# Patient Record
Sex: Male | Born: 1965 | Race: White | Hispanic: No | Marital: Married | Smoking: Never smoker
Health system: Southern US, Community
[De-identification: ages and names within clinical notes are randomized; demographics above are authoritative.]

## PROBLEM LIST (undated history)

## (undated) DIAGNOSIS — I10 Essential (primary) hypertension: Secondary | ICD-10-CM

---

## 2004-02-13 ENCOUNTER — Ambulatory Visit: Payer: Self-pay | Admitting: Infectious Diseases

## 2004-02-13 ENCOUNTER — Inpatient Hospital Stay (HOSPITAL_COMMUNITY): Admission: EM | Admit: 2004-02-13 | Discharge: 2004-02-19 | Payer: Self-pay | Admitting: Emergency Medicine

## 2014-09-12 ENCOUNTER — Emergency Department (INDEPENDENT_AMBULATORY_CARE_PROVIDER_SITE_OTHER): Payer: 59

## 2014-09-12 ENCOUNTER — Encounter: Payer: Self-pay | Admitting: *Deleted

## 2014-09-12 ENCOUNTER — Emergency Department
Admission: EM | Admit: 2014-09-12 | Discharge: 2014-09-12 | Disposition: A | Discharge status: Home / Self Care | Payer: 59 | Source: Home / Self Care | Attending: Family Medicine | Admitting: Family Medicine

## 2014-09-12 DIAGNOSIS — S62662B Nondisplaced fracture of distal phalanx of right middle finger, initial encounter for open fracture: Secondary | ICD-10-CM | POA: Diagnosis not present

## 2014-09-12 DIAGNOSIS — S62632A Displaced fracture of distal phalanx of right middle finger, initial encounter for closed fracture: Secondary | ICD-10-CM

## 2014-09-12 DIAGNOSIS — W108XXA Fall (on) (from) other stairs and steps, initial encounter: Secondary | ICD-10-CM

## 2014-09-12 DIAGNOSIS — W109XXA Fall (on) (from) unspecified stairs and steps, initial encounter: Secondary | ICD-10-CM

## 2014-09-12 DIAGNOSIS — X58XXXA Exposure to other specified factors, initial encounter: Secondary | ICD-10-CM

## 2014-09-12 DIAGNOSIS — S61212A Laceration without foreign body of right middle finger without damage to nail, initial encounter: Secondary | ICD-10-CM

## 2014-09-12 HISTORY — DX: Essential (primary) hypertension: I10

## 2014-09-12 MED ORDER — CEPHALEXIN 500 MG PO CAPS: 500.0000 mg | ORAL_CAPSULE | Freq: Two times a day (BID) | ORAL | Status: DC

## 2014-09-12 NOTE — ED Notes (Signed)
Pt reports tripping and felling into his stairs, jamming his right 3rd finger today. The skin @ the bend of the finger broke open. Irrigated with NS. Tetanus UTD.

## 2014-09-12 NOTE — ED Provider Notes (Signed)
CSN: [id number]     Arrival date & time 09/12/14  1808 History   First MD Initiated Contact with Patient 09/12/14 1819     Chief Complaint  Patient presents with  . Finger Injury   (Consider location/radiation/quality/duration/timing/severity/associated sxs/prior Treatment) HPI Pt is a 49yo male presenting to [facility] with c/o pain to Right middle finger after trip and fall on stairs just PTA. Pt states he jammed his Right middle finger into the stairs trying to catch his fall. Pain is aching and sore, mild in severity. Minimally worse with movement. Pt notes laceration across joint on the bottom of his finger. Bleeding controlled PTA. Denies hitting his head or any other injuries from fall. No pain medication taken PTA. Tetanus is UTD.  Past Medical History  Diagnosis Date  . Hypertension    History reviewed. No pertinent past surgical history. History reviewed. No pertinent family history. Social History  Substance Use Topics  . Smoking status: Never Smoker   . Smokeless tobacco: Never Used  . Alcohol Use: Yes    Review of Systems  Musculoskeletal: Positive for myalgias and arthralgias. Negative for joint swelling.       Right middle finger  Skin: Positive for wound. Negative for color change.  Neurological: Negative for weakness and numbness.    Allergies  Omnicef  Home Medications   Prior to Admission medications   Medication Sig Start Date End Date Taking? Authorizing Provider  lisinopril (PRINIVIL,ZESTRIL) 40 MG tablet Take 40 mg by mouth daily.   Yes Historical Provider, MD  Nebivolol HCl (BYSTOLIC PO) Take by mouth.   Yes Historical Provider, MD  cephALEXin (KEFLEX) 500 MG capsule Take 1 capsule (500 mg total) by mouth 2 (two) times daily. 10 09/12/14   Junius Finner, PA-C   Meds Ordered and Administered this Visit  Medications - No data to display  BP 134/87 mmHg  Pulse 68  Resp 14  Ht 6' 0.5" (1.842 m)  Wt 214 lb (97.07 kg)  BMI 28.61 kg/m2  SpO2 97% No data  found.   Physical Exam  Constitutional: He is oriented to person, place, and time. He appears well-developed and well-nourished.  HENT:  Head: Normocephalic and atraumatic.  Eyes: EOM are normal.  Neck: Normal range of motion.  Cardiovascular: Normal rate.   Right middle finger: cap refill <3 seconds  Pulmonary/Chest: Effort normal.  Musculoskeletal: Normal range of motion. He exhibits tenderness. He exhibits no edema.  Right middle finger: laceration on volar aspect DIP joint, full flexion and extension with 5/5 strength. Mild tenderness.  Neurological: He is alert and oriented to person, place, and time.  Right middle finger, distal aspect: sensation to light and sharp touch in tact.  Skin: Skin is warm and dry.  Distal Right middle finger, DIP joint, volar aspect: 1cm superficial laceration. Bleeding controlled. No foreign bodies seen or palpated.  Psychiatric: He has a normal mood and affect. His behavior is normal.  Nursing note and vitals reviewed.   ED Course  LACERATION REPAIR Date/Time: 09/13/2014 9:57 AM Performed by: Junius Finner Authorized by: Donna Christen A Consent: Verbal consent obtained. Risks and benefits: risks, benefits and alternatives were discussed Consent given by: patient Site marked: the operative site was marked Patient identity confirmed: verbally with patient Body area: upper extremity Location details: right long finger Laceration length: 1 cm Foreign bodies: no foreign bodies Tendon involvement: none Nerve involvement: none Vascular damage: no Patient sedated: no Irrigation solution: saline Irrigation method: syringe Amount of cleaning: extensive Debridement:  none Degree of undermining: none Skin closure: Steri-Strips Number of sutures: 3 Approximation: loose Approximation difficulty: simple Dressing: 4x4 sterile gauze and splint Patient tolerance: Patient tolerated the procedure well with no immediate complications   (including  critical care time)  Labs Review Labs Reviewed - No data to display  Imaging Review Dg Finger Middle Right  09/12/2014   CLINICAL DATA:  Laceration.  Finger injury.  EXAM: RIGHT MIDDLE FINGER 2+V  COMPARISON:  None.  FINDINGS: 1 mm avulsion fracture ventral to the DIP joint compatible with acute fracture. Normal joint space. No other fracture. Ventral laceration.  IMPRESSION: Tiny avulsion fracture of the volar aspect of the DIP joint.   Electronically Signed   By: Marlan Palau M.D.   On: 09/12/2014 19:35    Static finger splint applied.   MDM   1. Open nondisplaced fracture of distal phalanx of right middle finger, initial encounter   2. Fall on stairs, initial encounter   3. Laceration of right middle finger w/o foreign body w/o damage to nail, initial encounter    Right distal middle finger pain and laceration. Full range of motion at DIP. Sensation normal.   Plain films: tiny avulsion fracture of volar aspect of DIP joint.   Consulted with Dr. Denyse Amass, Sports Medicine, agreed to laceration does not need to be sutured closed as it is superficial. Placed finger in partial flexion with finger splint. F/u in office by Thursday or Friday of this week (9/8 or 9/9)  Rx: Keflex. Pt notes he has Omnicef as an "allergic reaction" but is sure it was acetaminophen he was taking at the same time. Pt believes he has had Keflex before w/o any reactions. Advised pt to stop medication and notify UC if rash occurs, antibiotic will be changed.  Home care instructions provided for splint and steri strip care. Patient verbalized understanding and agreement with treatment plan.    Junius Finner, PA-C 09/13/14 1003

## 2014-09-13 DIAGNOSIS — S62662B Nondisplaced fracture of distal phalanx of right middle finger, initial encounter for open fracture: Secondary | ICD-10-CM

## 2014-09-15 ENCOUNTER — Encounter: Payer: Self-pay | Admitting: Family Medicine

## 2014-09-15 ENCOUNTER — Ambulatory Visit (INDEPENDENT_AMBULATORY_CARE_PROVIDER_SITE_OTHER): Payer: 59 | Admitting: Family Medicine

## 2014-09-15 VITALS — BP 132/85 | HR 66 | Wt 215.0 lb

## 2014-09-15 DIAGNOSIS — S63639A Sprain of interphalangeal joint of unspecified finger, initial encounter: Secondary | ICD-10-CM

## 2014-09-15 NOTE — Progress Notes (Signed)
   Subjective:    I'm seeing this patient as a consultation for:  Junius Finner PA-C  CC: right long finger injury.   HPI: Patient was seen in urgent care on September 5 following the injury to his right middle finger. He tripped and fell landing on his finger. This forced of the distal interphalangeal joint into extension. He suffered a laceration and was diagnosed with an open fracture. Tetanus up-to-date less than 2 years ago.  Past medical history, Surgical history, Family history not pertinant except as noted below, Social history, Allergies, and medications have been entered into the medical record, reviewed, and no changes needed.   Review of Systems: No headache, visual changes, nausea, vomiting, diarrhea, constipation, dizziness, abdominal pain, skin rash, fevers, chills, night sweats, weight loss, swollen lymph nodes, body aches, joint swelling, muscle aches, chest pain, shortness of breath, mood changes, visual or auditory hallucinations.   Objective:    Filed Vitals:   09/15/14 0903  BP: 132/85  Pulse: 66   General: Well Developed, well nourished, and in no acute distress.  Neuro/Psych: Alert and oriented x3, extra-ocular muscles intact, able to move all 4 extremities, sensation grossly intact. Skin: Warm and dry, no rashes noted.  Respiratory: Not using accessory muscles, speaking in full sentences, trachea midline.  Cardiovascular: Pulses palpable, no extremity edema. Abdomen: Does not appear distended. MSK: Right long finger. 1 cm superficial laceration at the flexor crease at the DIP joint. The laceration does not extend deep. Patient has intact flexion and extension strength. Capillary refill and sensation are intact. No rotational defect noted.   X-rays reviewed from September 5  No results found for this or any previous visit (from the past 24 hour(s)). No results found.  Impression and Recommendations:   This case required medical decision making of moderate  complexity.

## 2014-09-15 NOTE — Patient Instructions (Signed)
Thank you for coming in today. Return in 2 weeks to recheck fracture.  Keep the wound clean and covered.   Finger Fracture (Phalangeal) A broken bone of the finger (phalangealfracture) is a common injury for athletes. A single injury (trauma) is likely to fracture multiple bones on the same or different fingers. SYMPTOMS   Severe pain, at the time of injury.  Pain, tenderness, swelling, and later bruising of the finger and then the hand.  Visible deformity, if the fracture is complete and the bone fragments separate enough to distort the normal shape.  Numbness or coldness from swelling in the finger, causing pressure on blood vessels or nerves (uncommon). CAUSES  Direct or indirect injury (trauma) to the finger.  RISK INCREASES WITH:   Contact sports (football, rugby) or other sports where injury to the hand is likely (soccer, baseball, basketball).  Sports that require hitting (boxing, martial arts).  History of bone or joint disease, such as osteoporosis, or previous bone restraint.  Poor hand strength and flexibility. PREVENTION   For contact sports, wear appropriate and properly fitted protective equipment for the hand.  Learn and use proper technique when hitting, punching, or landing after a fall.  If you had a previous finger injury or hand restraint, use tape or padding to protect the finger when playing sports where finger injury is likely. PROGNOSIS  With proper treatment and normal alignment of the bones, healing can usually be expected in 4 to 6 weeks. Sometimes, surgery is needed.  RELATED COMPLICATIONS   Fracture does not heal (nonunion).  Bone heals in wrong position (malunion).  Chronic pain, stiffness, or swelling of the hand.  Excessive bleeding, causing pressure on nerves and blood vessels.  Unstable or arthritic joint, following repeated injury or delayed treatment.  Hindrance of normal growth in children.  Infection in skin broken over the  fracture (open fracture) or at the incision or pin sites from surgery.  Shortening of injured bones.  Bony bumps or loss of shape of the fingers.  Arthritic or stiff finger joint, if the fracture reaches the joint. TREATMENT  If the bones are properly aligned, treatment involves ice and medicine to reduce pain and inflammation. Then, the finger is restrained for 4 or more weeks, to allow for healing. If the fracture is out of alignment (displaced), involves more than one bone, or involves a joint, surgery is usually advised. Surgery often involves placing removable pins, screws, and sometimes plates, to hold the bones in proper alignment. After restraint (with or without surgery), stretching and strengthening exercises are needed. Exercises may be completed at home or with a therapist. For certain sports, wearing a splint or having the finger taped during future activity is advised.  MEDICATION   If pain medicine is needed, nonsteroidal anti-inflammatory medicines (aspirin and ibuprofen), or other minor pain relievers (acetaminophen), are often advised.  Do not take pain medicine for 7 days before surgery.  Prescription pain relievers are usually prescribed only after surgery. Use only as directed and only as much as you need. COLD THERAPY   Cold treatment (icing) relieves pain and reduces inflammation. Cold treatment should be applied for 10 to 15 minutes every 2 to 3 hours, and immediately after activity that aggravates your symptoms. Use ice packs or an ice massage. SEEK MEDICAL CARE IF:   Pain, tenderness, or swelling gets worse, despite treatment.  You experience pain, numbness, or coldness in the hand.  Blue, gray, or dark color appears in the fingernails.  Any  of the following occur after surgery: fever, increased pain, swelling, redness, drainage of fluids, or bleeding in the affected area.  New, unexplained symptoms develop. (Drugs used in treatment may produce side  effects.) Document Released: 12/24/2004 Document Revised: 03/18/2011 Document Reviewed: 04/07/2008 [facility] Patient Information 2015 [place], [place]. This information is not intended to replace advice given to you by your health care provider. Make sure you discuss any questions you have with your health care provider.

## 2014-09-15 NOTE — Assessment & Plan Note (Signed)
Right long DIP. Plan for stack splints and routine wound management. Fracture does not appear to be open. Complete Keflex prescription.

## 2014-10-07 ENCOUNTER — Ambulatory Visit (INDEPENDENT_AMBULATORY_CARE_PROVIDER_SITE_OTHER): Payer: 59

## 2014-10-07 ENCOUNTER — Encounter: Payer: Self-pay | Admitting: Family Medicine

## 2014-10-07 ENCOUNTER — Ambulatory Visit (INDEPENDENT_AMBULATORY_CARE_PROVIDER_SITE_OTHER): Payer: 59 | Admitting: Family Medicine

## 2014-10-07 VITALS — BP 128/85 | HR 71 | Wt 213.0 lb

## 2014-10-07 DIAGNOSIS — X58XXXD Exposure to other specified factors, subsequent encounter: Secondary | ICD-10-CM | POA: Diagnosis not present

## 2014-10-07 DIAGNOSIS — S63639D Sprain of interphalangeal joint of unspecified finger, subsequent encounter: Secondary | ICD-10-CM

## 2014-10-07 DIAGNOSIS — S62632D Displaced fracture of distal phalanx of right middle finger, subsequent encounter for fracture with routine healing: Secondary | ICD-10-CM

## 2014-10-07 NOTE — Progress Notes (Signed)
Danny Strong is a 49 y.o. male who presents to [facility]  today for follow-up fracture. Patient was seen initially on September 5 where he was diagnosed a for plate avulsion fracture of the distal interphalangeal joint of the right middle finger. In the interim he has done well with minimal splinting and buddy taping. He notes his pain is nearly gone however he continues to have some pain down again in the DIP when he hits it on something. He denies any radiating pain weakness or numbness fevers or chills.   Past Medical History  Diagnosis Date  . Hypertension    No past surgical history on file. Social History  Substance Use Topics  . Smoking status: Never Smoker   . Smokeless tobacco: Never Used  . Alcohol Use: Yes   family history is not on file.  ROS as above Medications: Current Outpatient Prescriptions  Medication Sig Dispense Refill  . lisinopril (PRINIVIL,ZESTRIL) 40 MG tablet Take 40 mg by mouth daily.    . Nebivolol HCl (BYSTOLIC PO) Take by mouth.     No current facility-administered medications for this visit.   Allergies  Allergen Reactions  . Omnicef [Cefdinir]      Exam:  BP 128/85 mmHg  Pulse 71  Wt 213 lb (96.616 kg) Gen: Well NAD Right third digit well-appearing well-appearing scar with no significant erythema or tenderness. Slight decreased flexion range of motion. Intact flexion strength  X-ray finger preliminary personal read shows persistent avulsion flexion at the volar plate of the DIP joint. Otherwise normal. Awaiting formal radiology review  No results found for this or any previous visit (from the past 24 hour(s)). No results found.   Please see individual assessment and plan sections.

## 2014-10-07 NOTE — Assessment & Plan Note (Signed)
Doing well no evidence of infection. Follow-up in one month. Work on finger motion. Protect the finger.

## 2014-10-07 NOTE — Patient Instructions (Signed)
Thank you for coming in today. Return in 1 month for recheck.  Work on range of motion.  Protect the finger.

## 2016-02-28 IMAGING — CR DG FINGER MIDDLE 2+V*R*
3 series · 3 of 3 positions shown · non-contrast
Comparison: Right middle finger x-ray of September 12, 2014

CLINICAL DATA: Follow-up of an injury of the DIP joint of the third
finger.

EXAM:
RIGHT MIDDLE FINGER 2+V

[finger ap]
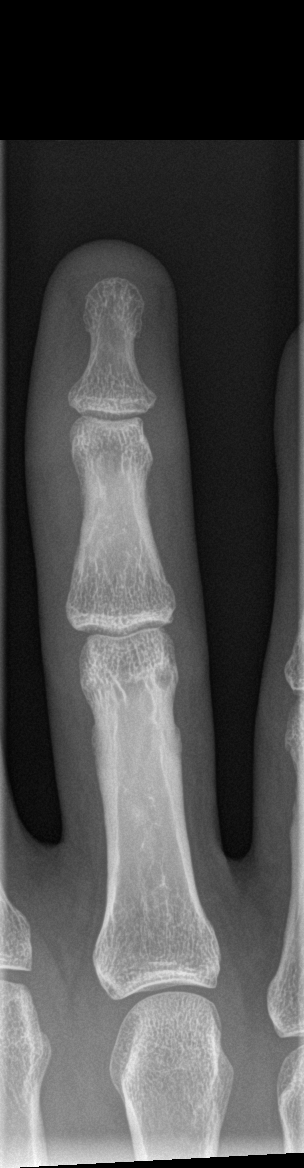

[finger obl]
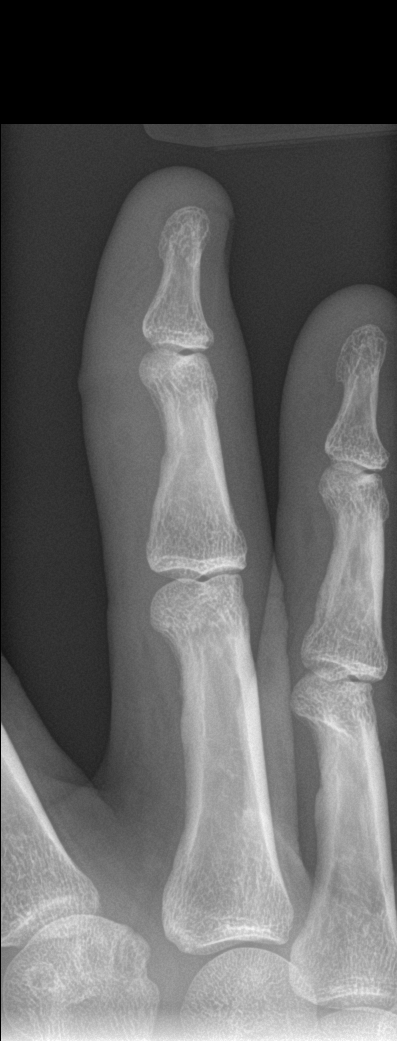

[finger lat]
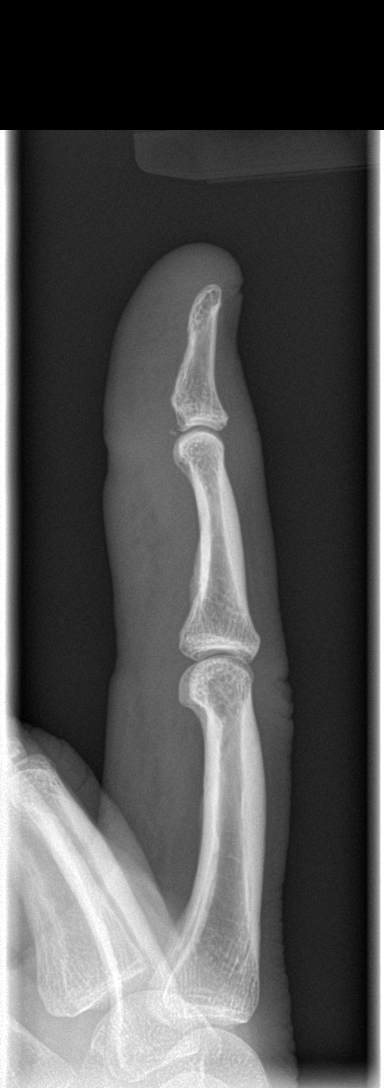

[3 of 3 positions shown; findings below may reference images not displayed]

FINDINGS: The tiny avulsion fracture fragment remains visible along the volar
aspect of the base of the distal phalanx. The remainder the bony
structures are unremarkable. The joint spaces are preserved. Mild
soft tissue swelling over the distal aspect of the digit is present.
IMPRESSION: The avulsion fracture fragment from the volar aspect of the base of
the distal phalanx is still visible. There is no acute bony
abnormality.

## 2017-01-08 DIAGNOSIS — M25642 Stiffness of left hand, not elsewhere classified: Secondary | ICD-10-CM | POA: Diagnosis not present

## 2017-01-08 DIAGNOSIS — S52502D Unspecified fracture of the lower end of left radius, subsequent encounter for closed fracture with routine healing: Secondary | ICD-10-CM | POA: Diagnosis not present

## 2017-01-08 DIAGNOSIS — M25542 Pain in joints of left hand: Secondary | ICD-10-CM | POA: Diagnosis not present

## 2017-01-08 DIAGNOSIS — S52602D Unspecified fracture of lower end of left ulna, subsequent encounter for closed fracture with routine healing: Secondary | ICD-10-CM | POA: Diagnosis not present

## 2017-01-10 DIAGNOSIS — M25642 Stiffness of left hand, not elsewhere classified: Secondary | ICD-10-CM | POA: Diagnosis not present

## 2017-01-10 DIAGNOSIS — S52502D Unspecified fracture of the lower end of left radius, subsequent encounter for closed fracture with routine healing: Secondary | ICD-10-CM | POA: Diagnosis not present

## 2017-01-10 DIAGNOSIS — S52602D Unspecified fracture of lower end of left ulna, subsequent encounter for closed fracture with routine healing: Secondary | ICD-10-CM | POA: Diagnosis not present

## 2017-01-10 DIAGNOSIS — M25542 Pain in joints of left hand: Secondary | ICD-10-CM | POA: Diagnosis not present

## 2017-01-14 DIAGNOSIS — M25542 Pain in joints of left hand: Secondary | ICD-10-CM | POA: Diagnosis not present

## 2017-01-14 DIAGNOSIS — S52502D Unspecified fracture of the lower end of left radius, subsequent encounter for closed fracture with routine healing: Secondary | ICD-10-CM | POA: Diagnosis not present

## 2017-01-14 DIAGNOSIS — S52602D Unspecified fracture of lower end of left ulna, subsequent encounter for closed fracture with routine healing: Secondary | ICD-10-CM | POA: Diagnosis not present

## 2017-01-14 DIAGNOSIS — M25642 Stiffness of left hand, not elsewhere classified: Secondary | ICD-10-CM | POA: Diagnosis not present

## 2017-01-15 DIAGNOSIS — S52602D Unspecified fracture of lower end of left ulna, subsequent encounter for closed fracture with routine healing: Secondary | ICD-10-CM | POA: Diagnosis not present

## 2017-01-15 DIAGNOSIS — S66919A Strain of unspecified muscle, fascia and tendon at wrist and hand level, unspecified hand, initial encounter: Secondary | ICD-10-CM | POA: Diagnosis not present

## 2017-01-15 DIAGNOSIS — S52502D Unspecified fracture of the lower end of left radius, subsequent encounter for closed fracture with routine healing: Secondary | ICD-10-CM | POA: Diagnosis not present

## 2017-01-17 DIAGNOSIS — M25642 Stiffness of left hand, not elsewhere classified: Secondary | ICD-10-CM | POA: Diagnosis not present

## 2017-01-17 DIAGNOSIS — S52502D Unspecified fracture of the lower end of left radius, subsequent encounter for closed fracture with routine healing: Secondary | ICD-10-CM | POA: Diagnosis not present

## 2017-01-17 DIAGNOSIS — S52602D Unspecified fracture of lower end of left ulna, subsequent encounter for closed fracture with routine healing: Secondary | ICD-10-CM | POA: Diagnosis not present

## 2017-01-17 DIAGNOSIS — M25542 Pain in joints of left hand: Secondary | ICD-10-CM | POA: Diagnosis not present

## 2017-01-21 DIAGNOSIS — M25642 Stiffness of left hand, not elsewhere classified: Secondary | ICD-10-CM | POA: Diagnosis not present

## 2017-01-21 DIAGNOSIS — S52602D Unspecified fracture of lower end of left ulna, subsequent encounter for closed fracture with routine healing: Secondary | ICD-10-CM | POA: Diagnosis not present

## 2017-01-21 DIAGNOSIS — S52502D Unspecified fracture of the lower end of left radius, subsequent encounter for closed fracture with routine healing: Secondary | ICD-10-CM | POA: Diagnosis not present

## 2017-01-21 DIAGNOSIS — M25542 Pain in joints of left hand: Secondary | ICD-10-CM | POA: Diagnosis not present

## 2017-01-23 DIAGNOSIS — S66919A Strain of unspecified muscle, fascia and tendon at wrist and hand level, unspecified hand, initial encounter: Secondary | ICD-10-CM | POA: Diagnosis not present

## 2017-01-23 DIAGNOSIS — M66242 Spontaneous rupture of extensor tendons, left hand: Secondary | ICD-10-CM | POA: Diagnosis not present

## 2017-01-23 DIAGNOSIS — X58XXXA Exposure to other specified factors, initial encounter: Secondary | ICD-10-CM | POA: Diagnosis not present

## 2017-01-23 DIAGNOSIS — Y939 Activity, unspecified: Secondary | ICD-10-CM | POA: Diagnosis not present

## 2017-01-23 DIAGNOSIS — G8918 Other acute postprocedural pain: Secondary | ICD-10-CM | POA: Diagnosis not present

## 2017-02-03 DIAGNOSIS — S66919A Strain of unspecified muscle, fascia and tendon at wrist and hand level, unspecified hand, initial encounter: Secondary | ICD-10-CM | POA: Diagnosis not present

## 2017-02-26 DIAGNOSIS — S66912D Strain of unspecified muscle, fascia and tendon at wrist and hand level, left hand, subsequent encounter: Secondary | ICD-10-CM | POA: Diagnosis not present

## 2017-04-16 DIAGNOSIS — M9905 Segmental and somatic dysfunction of pelvic region: Secondary | ICD-10-CM | POA: Diagnosis not present

## 2017-04-16 DIAGNOSIS — M9902 Segmental and somatic dysfunction of thoracic region: Secondary | ICD-10-CM | POA: Diagnosis not present

## 2017-04-16 DIAGNOSIS — M9903 Segmental and somatic dysfunction of lumbar region: Secondary | ICD-10-CM | POA: Diagnosis not present

## 2017-04-16 DIAGNOSIS — M9904 Segmental and somatic dysfunction of sacral region: Secondary | ICD-10-CM | POA: Diagnosis not present

## 2017-04-18 DIAGNOSIS — Z0000 Encounter for general adult medical examination without abnormal findings: Secondary | ICD-10-CM | POA: Diagnosis not present

## 2017-04-18 DIAGNOSIS — Z6829 Body mass index (BMI) 29.0-29.9, adult: Secondary | ICD-10-CM | POA: Diagnosis not present

## 2017-04-18 DIAGNOSIS — Z13220 Encounter for screening for lipoid disorders: Secondary | ICD-10-CM | POA: Diagnosis not present

## 2017-04-18 DIAGNOSIS — Z1331 Encounter for screening for depression: Secondary | ICD-10-CM | POA: Diagnosis not present

## 2017-08-13 DIAGNOSIS — M9903 Segmental and somatic dysfunction of lumbar region: Secondary | ICD-10-CM | POA: Diagnosis not present

## 2017-08-13 DIAGNOSIS — M9902 Segmental and somatic dysfunction of thoracic region: Secondary | ICD-10-CM | POA: Diagnosis not present

## 2017-08-13 DIAGNOSIS — M9905 Segmental and somatic dysfunction of pelvic region: Secondary | ICD-10-CM | POA: Diagnosis not present

## 2017-08-13 DIAGNOSIS — M9904 Segmental and somatic dysfunction of sacral region: Secondary | ICD-10-CM | POA: Diagnosis not present

## 2017-12-19 DIAGNOSIS — J0100 Acute maxillary sinusitis, unspecified: Secondary | ICD-10-CM | POA: Diagnosis not present

## 2017-12-19 DIAGNOSIS — Z6829 Body mass index (BMI) 29.0-29.9, adult: Secondary | ICD-10-CM | POA: Diagnosis not present

## 2018-05-19 DIAGNOSIS — E782 Mixed hyperlipidemia: Secondary | ICD-10-CM | POA: Diagnosis not present

## 2018-05-19 DIAGNOSIS — Z125 Encounter for screening for malignant neoplasm of prostate: Secondary | ICD-10-CM | POA: Diagnosis not present

## 2018-05-19 DIAGNOSIS — K219 Gastro-esophageal reflux disease without esophagitis: Secondary | ICD-10-CM | POA: Diagnosis not present

## 2018-05-19 DIAGNOSIS — Z0000 Encounter for general adult medical examination without abnormal findings: Secondary | ICD-10-CM | POA: Diagnosis not present

## 2018-05-19 DIAGNOSIS — I10 Essential (primary) hypertension: Secondary | ICD-10-CM | POA: Diagnosis not present

## 2018-05-27 DIAGNOSIS — M9905 Segmental and somatic dysfunction of pelvic region: Secondary | ICD-10-CM | POA: Diagnosis not present

## 2018-05-27 DIAGNOSIS — M9903 Segmental and somatic dysfunction of lumbar region: Secondary | ICD-10-CM | POA: Diagnosis not present

## 2018-05-27 DIAGNOSIS — M9904 Segmental and somatic dysfunction of sacral region: Secondary | ICD-10-CM | POA: Diagnosis not present

## 2018-05-27 DIAGNOSIS — M9902 Segmental and somatic dysfunction of thoracic region: Secondary | ICD-10-CM | POA: Diagnosis not present

## 2019-04-21 DIAGNOSIS — L821 Other seborrheic keratosis: Secondary | ICD-10-CM | POA: Diagnosis not present

## 2019-04-21 DIAGNOSIS — L578 Other skin changes due to chronic exposure to nonionizing radiation: Secondary | ICD-10-CM | POA: Diagnosis not present

## 2019-04-21 DIAGNOSIS — L814 Other melanin hyperpigmentation: Secondary | ICD-10-CM | POA: Diagnosis not present

## 2019-04-21 DIAGNOSIS — L570 Actinic keratosis: Secondary | ICD-10-CM | POA: Diagnosis not present

## 2019-04-21 DIAGNOSIS — W908XXS Exposure to other nonionizing radiation, sequela: Secondary | ICD-10-CM | POA: Diagnosis not present

## 2019-05-24 DIAGNOSIS — Z1331 Encounter for screening for depression: Secondary | ICD-10-CM | POA: Diagnosis not present

## 2019-05-24 DIAGNOSIS — Z125 Encounter for screening for malignant neoplasm of prostate: Secondary | ICD-10-CM | POA: Diagnosis not present

## 2019-05-24 DIAGNOSIS — Z6829 Body mass index (BMI) 29.0-29.9, adult: Secondary | ICD-10-CM | POA: Diagnosis not present

## 2019-05-24 DIAGNOSIS — I10 Essential (primary) hypertension: Secondary | ICD-10-CM | POA: Diagnosis not present

## 2019-05-24 DIAGNOSIS — Z0000 Encounter for general adult medical examination without abnormal findings: Secondary | ICD-10-CM | POA: Diagnosis not present

## 2019-05-24 DIAGNOSIS — K219 Gastro-esophageal reflux disease without esophagitis: Secondary | ICD-10-CM | POA: Diagnosis not present

## 2020-11-01 ENCOUNTER — Other Ambulatory Visit (HOSPITAL_COMMUNITY): Payer: Self-pay

## 2020-11-01 MED ORDER — INFLUENZA VAC SPLIT QUAD 0.5 ML IM SUSY
PREFILLED_SYRINGE | INTRAMUSCULAR | 0 refills | Status: AC
  Filled 2020-11-01: qty 0.5, 1d supply, fill #0

## 2021-10-30 ENCOUNTER — Other Ambulatory Visit (HOSPITAL_BASED_OUTPATIENT_CLINIC_OR_DEPARTMENT_OTHER): Payer: Self-pay

## 2021-10-30 MED ORDER — FLUARIX QUADRIVALENT 0.5 ML IM SUSY
PREFILLED_SYRINGE | INTRAMUSCULAR | 0 refills | Status: AC
  Filled 2021-10-30: qty 0.5, 1d supply, fill #0

## 2022-10-30 ENCOUNTER — Ambulatory Visit: Payer: BC Managed Care – PPO

## 2022-10-30 DIAGNOSIS — Z23 Encounter for immunization: Secondary | ICD-10-CM
